# Patient Record
Sex: Female | Born: 2000 | Hispanic: Yes | Marital: Single | Smoking: Never smoker
Health system: Southern US, Community
[De-identification: ages and names within clinical notes are randomized; demographics above are authoritative.]

## PROBLEM LIST (undated history)

## (undated) DIAGNOSIS — D649 Anemia, unspecified: Secondary | ICD-10-CM

## (undated) HISTORY — PX: NO PAST SURGERIES: SHX2092

---
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
## 2020-11-15 ENCOUNTER — Other Ambulatory Visit: Payer: Self-pay | Admitting: Family Medicine

## 2020-11-15 ENCOUNTER — Other Ambulatory Visit (HOSPITAL_COMMUNITY): Payer: Self-pay | Admitting: Family Medicine

## 2020-11-15 DIAGNOSIS — R100 Acute abdomen: Secondary | ICD-10-CM

## 2020-11-21 ENCOUNTER — Other Ambulatory Visit: Payer: Self-pay | Admitting: Family Medicine

## 2020-11-21 DIAGNOSIS — R1031 Right lower quadrant pain: Secondary | ICD-10-CM

## 2020-11-22 ENCOUNTER — Other Ambulatory Visit: Payer: Self-pay

## 2020-11-22 ENCOUNTER — Ambulatory Visit
Admission: RE | Admit: 2020-11-22 | Discharge: 2020-11-22 | Disposition: A | Discharge status: Home / Self Care | Payer: Medicaid Other | Source: Ambulatory Visit | Attending: Family Medicine | Admitting: Family Medicine

## 2020-11-22 DIAGNOSIS — R1031 Right lower quadrant pain: Secondary | ICD-10-CM

## 2020-11-29 IMAGING — US US PELVIS COMPLETE WITH TRANSVAGINAL
1 series · 13 of 25 positions shown · non-contrast
Comparison: None

CLINICAL DATA: Initial evaluation for acute vaginal bleeding,
pelvic cramping. Evaluate IUD placement.



[Series 1: us pelvis complete with transvaginal · 13 of 116 slices shown]
[im 1/116]
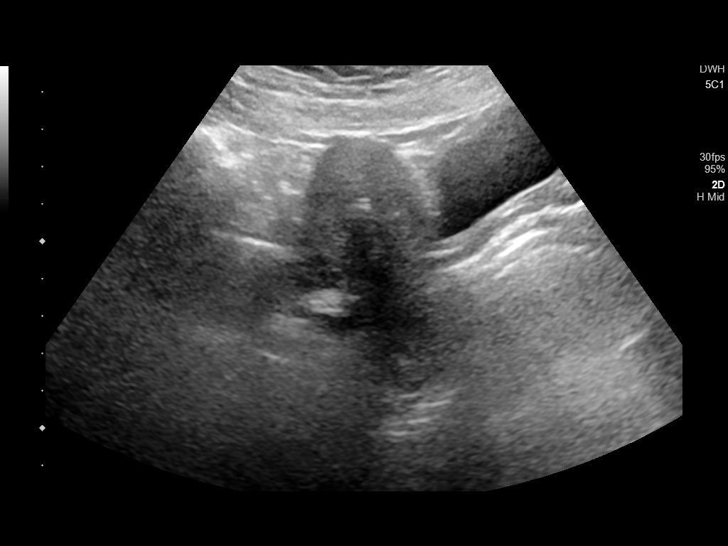
[im 10/116]
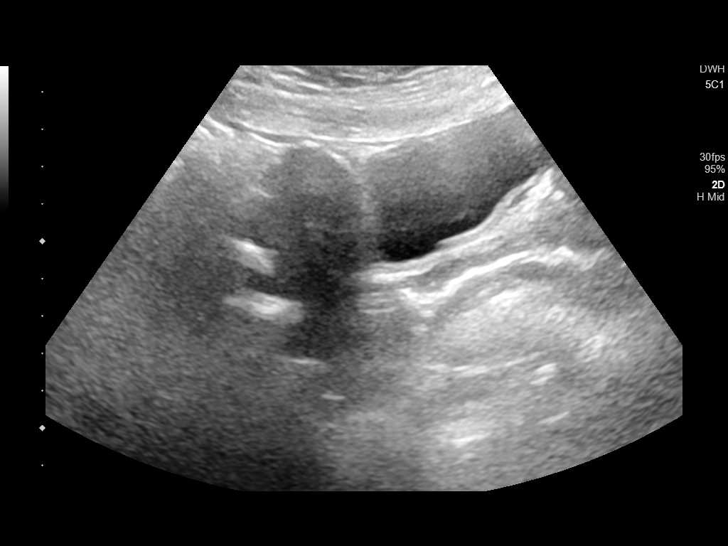
[im 20/116]
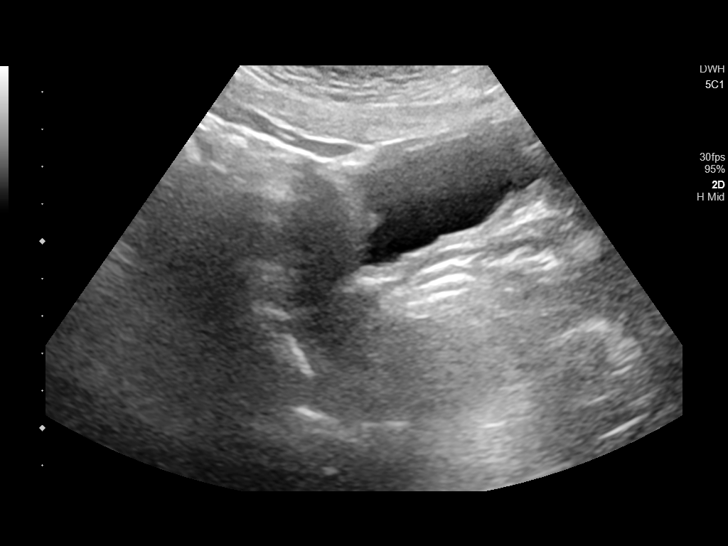
[im 29/116]
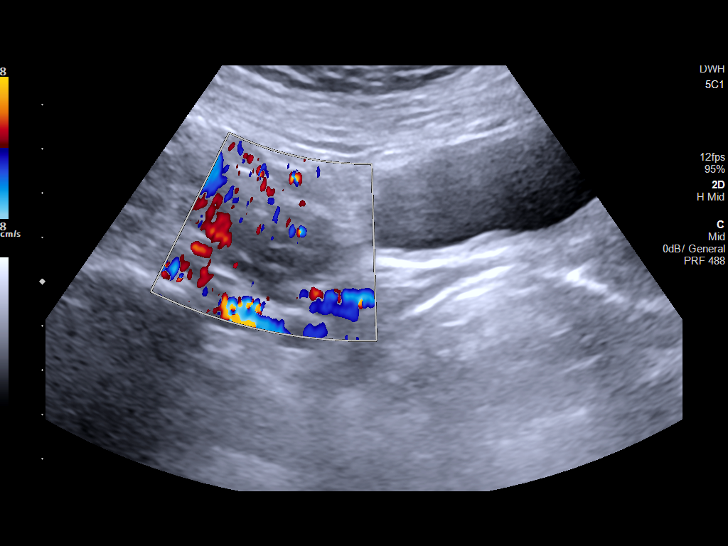
[im 39/116]
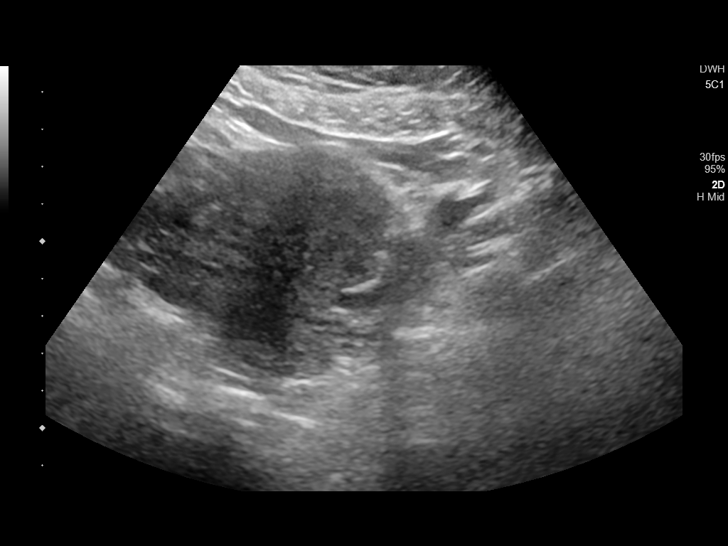
[im 48/116]
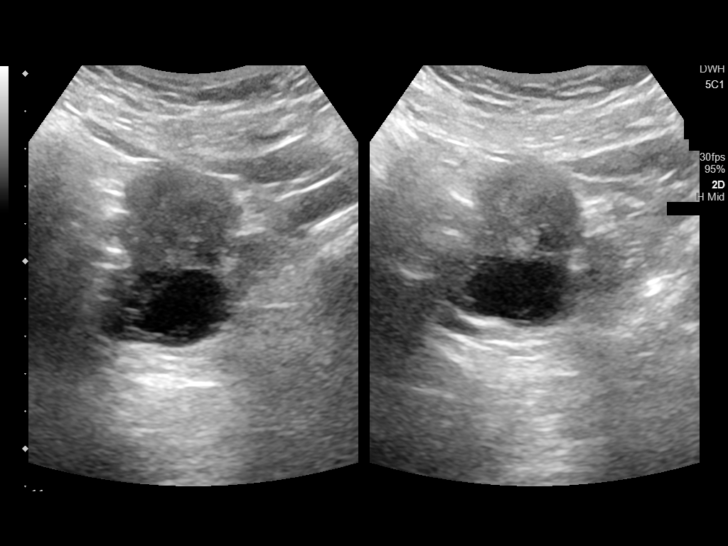
[im 58/116]
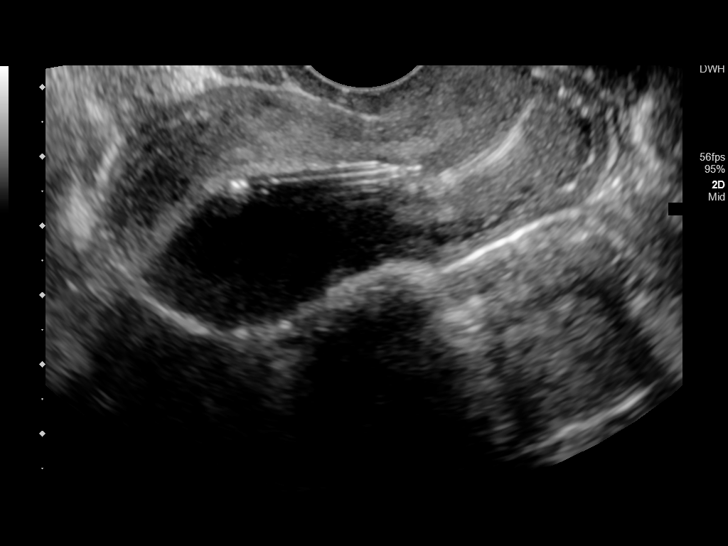
[im 68/116]
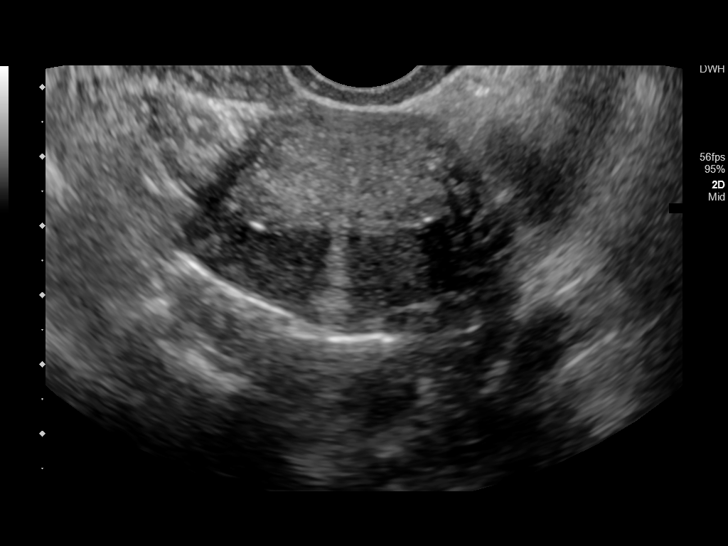
[im 77/116]
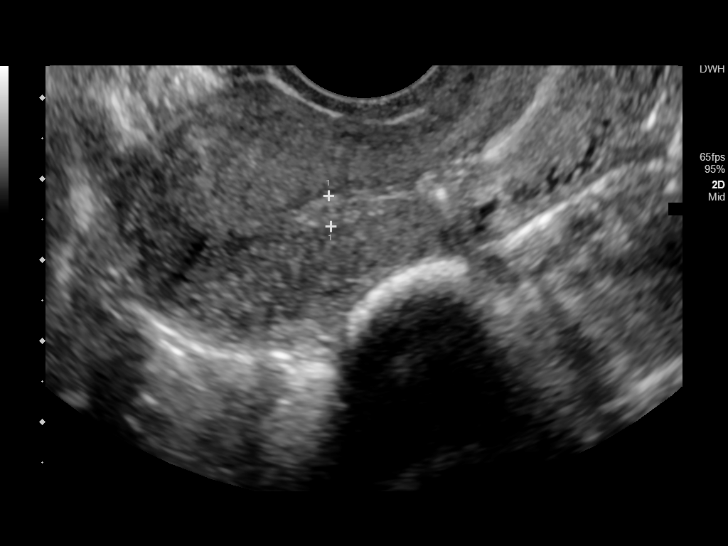
[im 87/116]
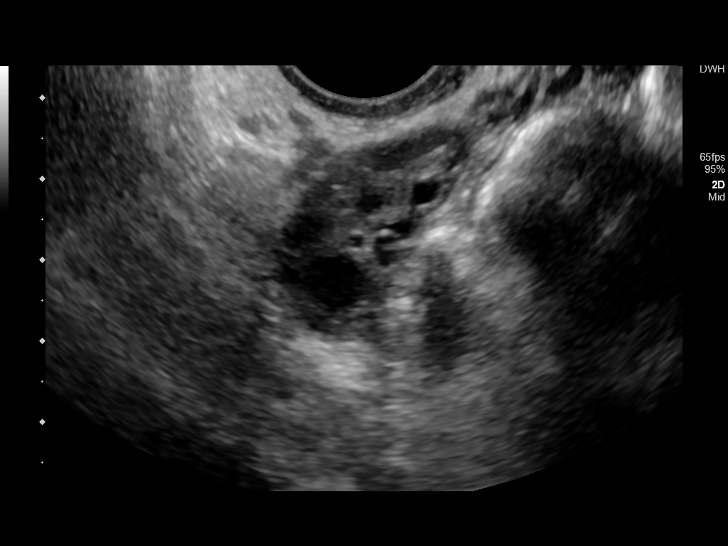
[im 96/116]
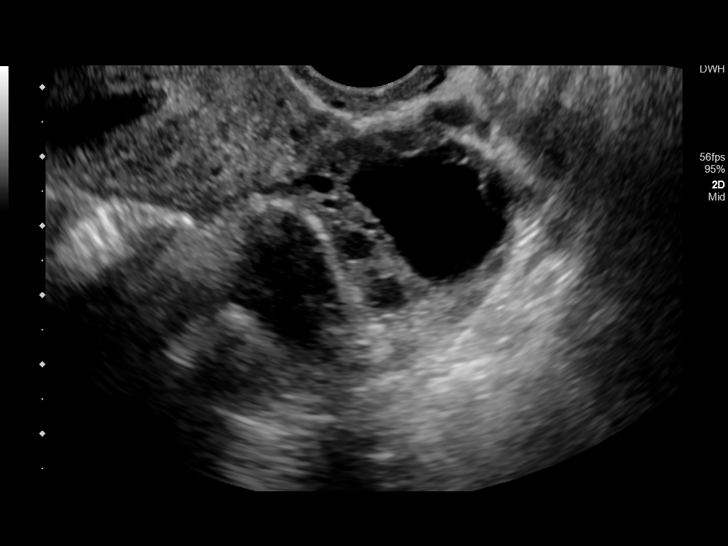
[im 106/116]
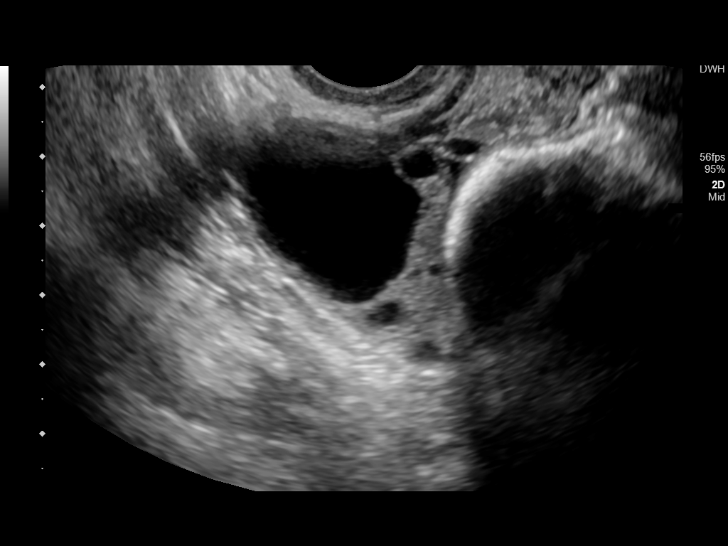
[im 116/116]
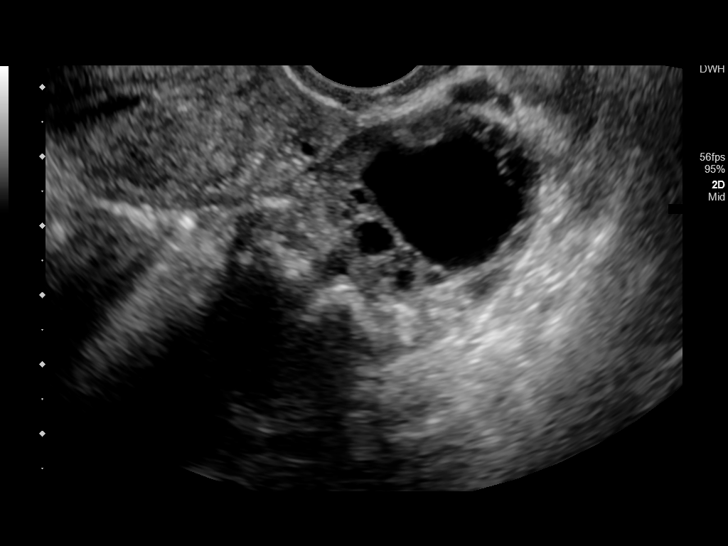

[13 of 25 positions shown; findings below may reference images not displayed]

FINDINGS: Uterus

Measurements: 6.7 x 3.5 x 4.4 cm = volume: 52.8 mL. No fibroids or
other mass visualized.

Endometrium

Thickness: 3.8 mm. No focal abnormality visualized. IUD in
appropriate position within the endometrial cavity.

Right ovary

Measurements: 3.1 x 1.5 x 1.8 cm = volume: 4.5 mL. Normal
appearance/no adnexal mass.

Left ovary

Measurements: 4.4 x 2.8 x 3.2 cm = volume: 20.9 mL. 2.8 x 2.3 x
cm simple cyst, most consistent with a normal physiologic follicular
cyst/dominant follicle.

Other findings

No abnormal free fluid.
IMPRESSION: 1. IUD in appropriate position within the endometrial cavity.
2. 2.8 cm simple left ovarian cyst, most consistent with a normal
physiologic follicular cyst/dominant follicle.
3. Otherwise unremarkable and normal pelvic ultrasound for age.

## 2022-02-12 ENCOUNTER — Ambulatory Visit: Payer: Medicaid Other

## 2022-03-21 IMAGING — US US PELVIS COMPLETE WITH TRANSVAGINAL
1 series · 13 of 25 positions shown · non-contrast
Comparison: Pelvic ultrasound dated 08/03/2019.

CLINICAL DATA: 19-year-old female with right lower quadrant
abdominal pain.

EXAM:
TRANSABDOMINAL AND TRANSVAGINAL ULTRASOUND OF PELVIS
TECHNIQUE: Both transabdominal and transvaginal ultrasound examinations of the
pelvis were performed. Transabdominal technique was performed for
global imaging of the pelvis including uterus, ovaries, adnexal
regions, and pelvic cul-de-sac. It was necessary to proceed with
endovaginal exam following the transabdominal exam to visualize the
endometrium and ovaries.

[Series 1: us pelvic complete with transvaginal · 13 of 94 slices shown]
[im 1/94]
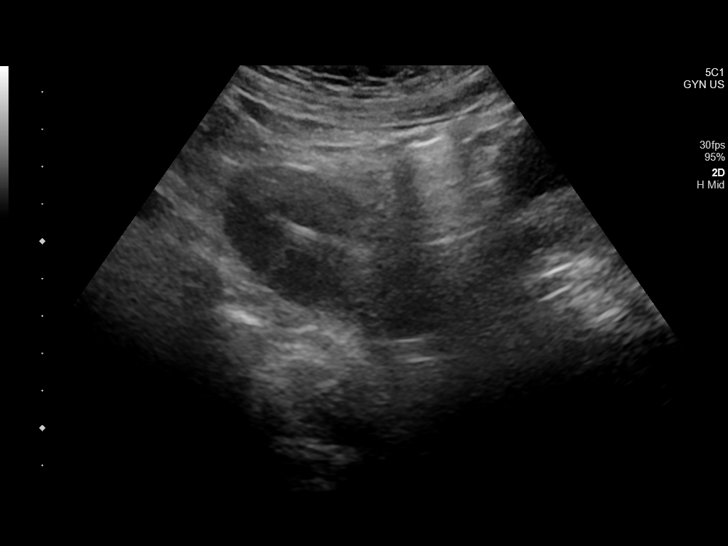
[im 8/94]
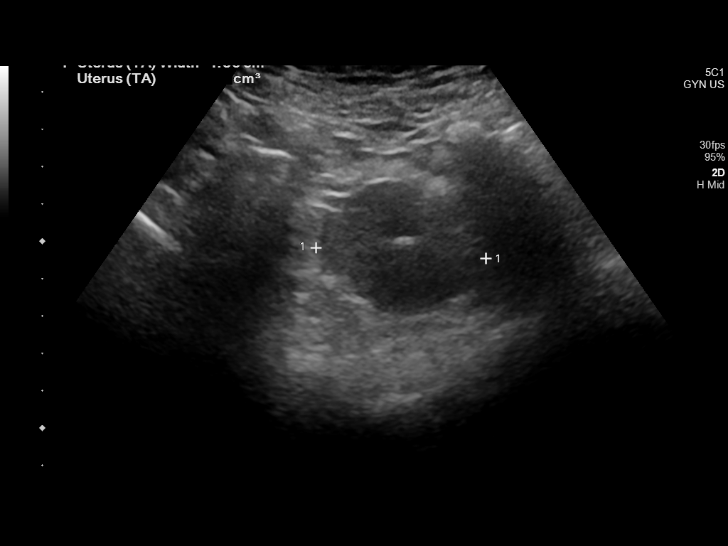
[im 16/94]
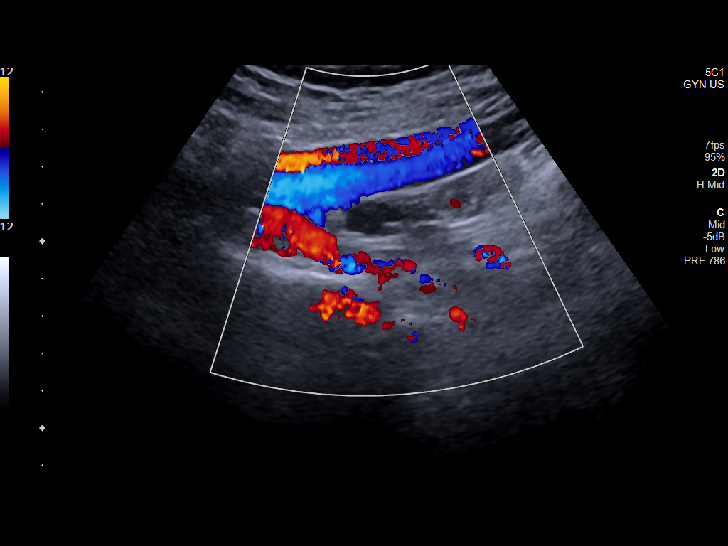
[im 24/94]
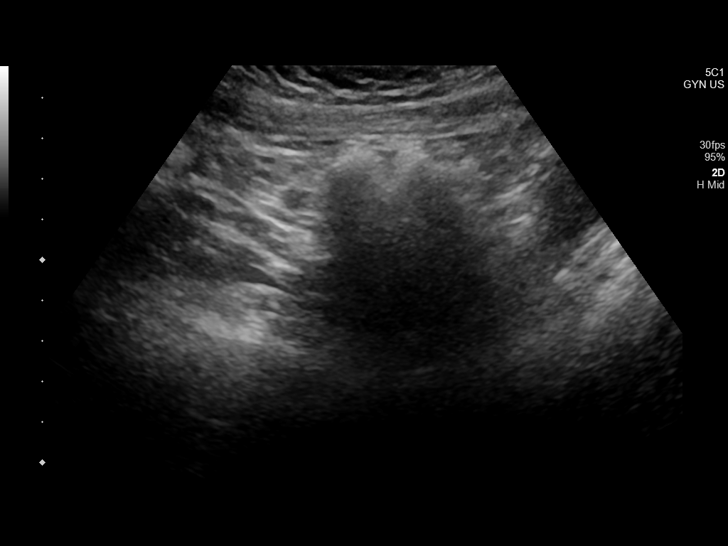
[im 32/94]
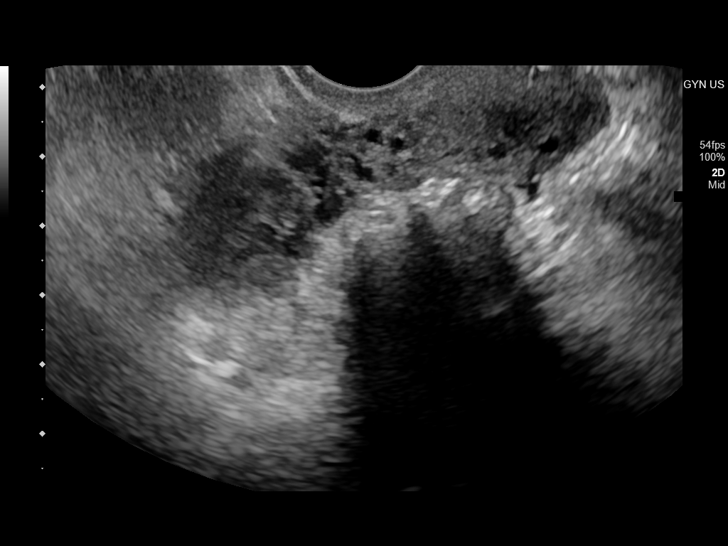
[im 39/94]
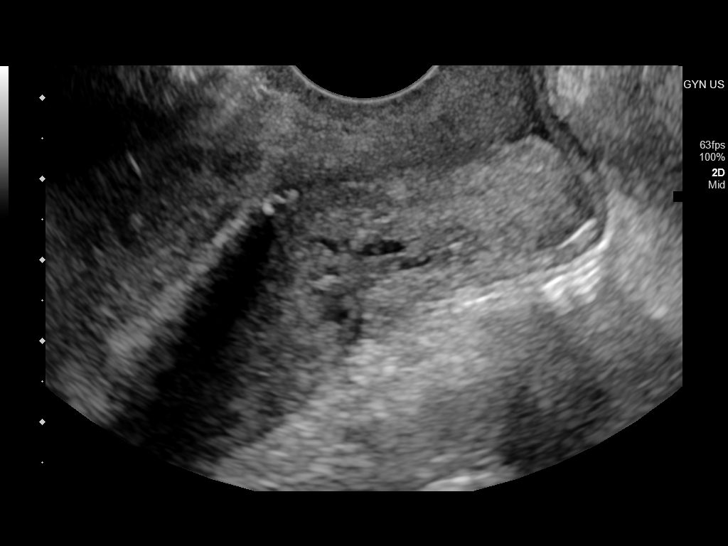
[im 47/94]
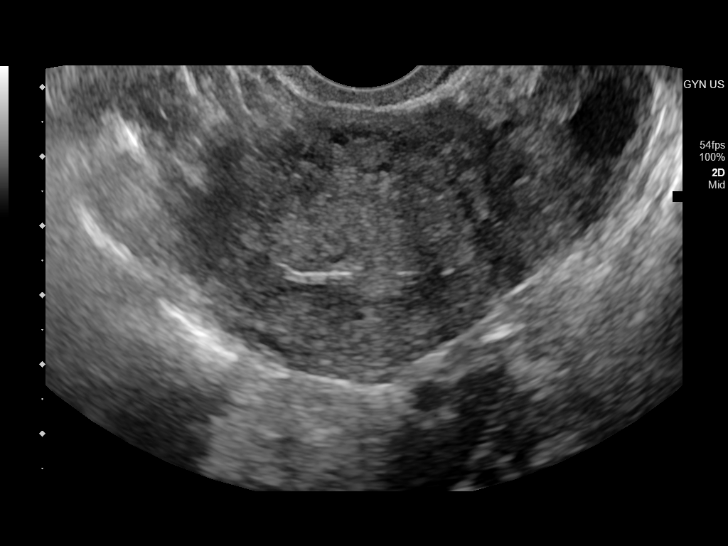
[im 55/94]
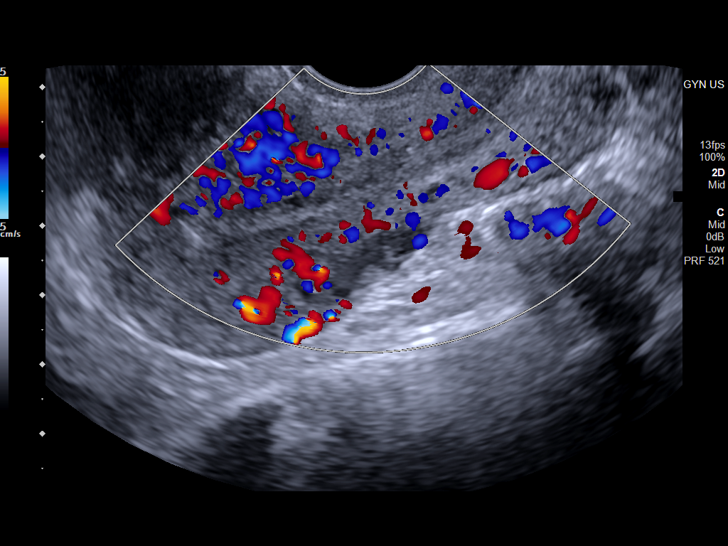
[im 63/94]
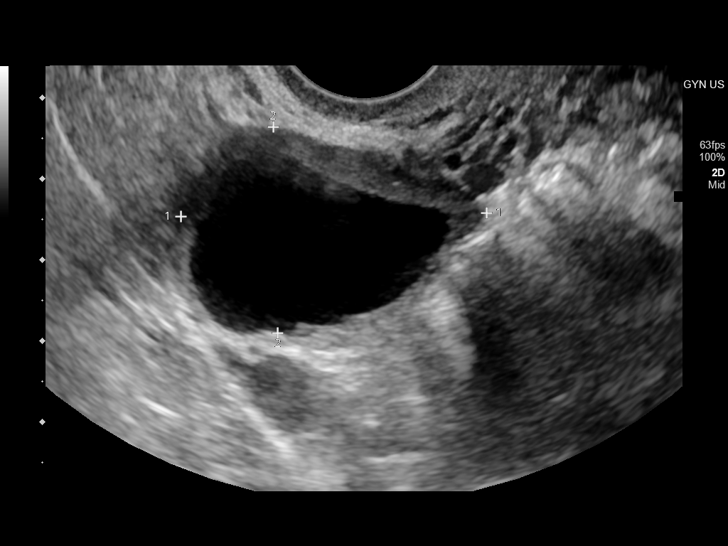
[im 70/94]
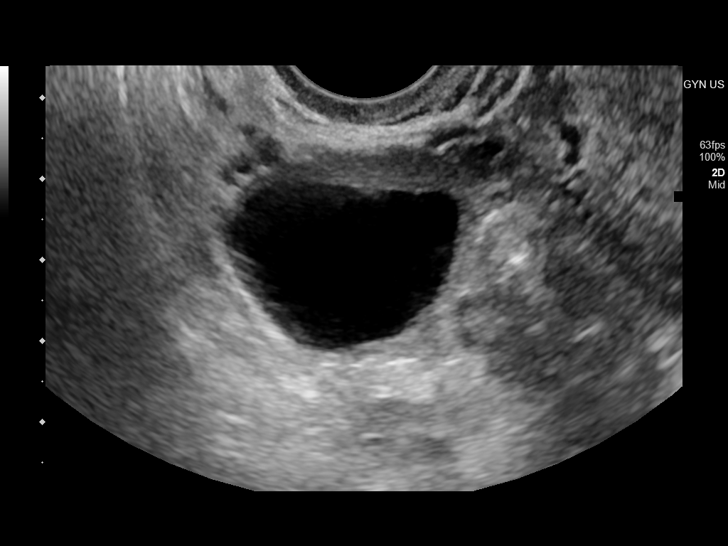
[im 78/94]
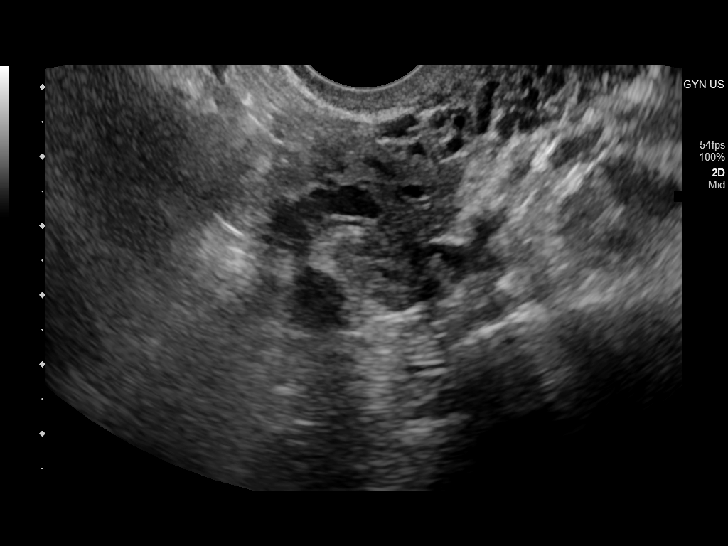
[im 86/94]
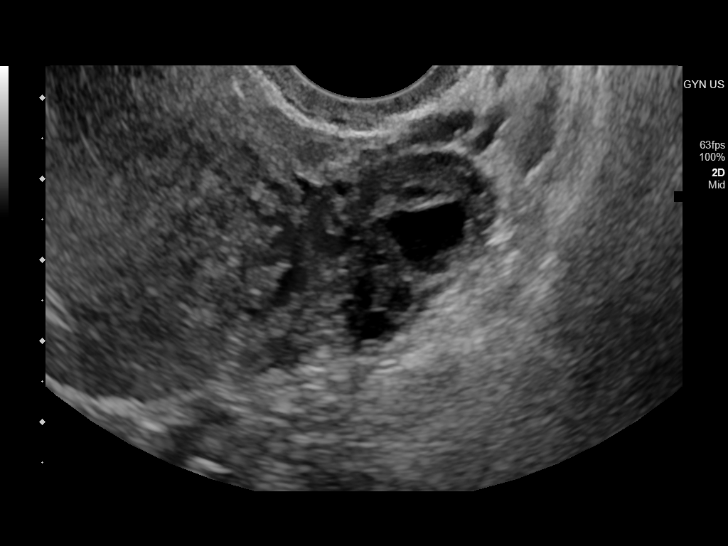
[im 94/94]
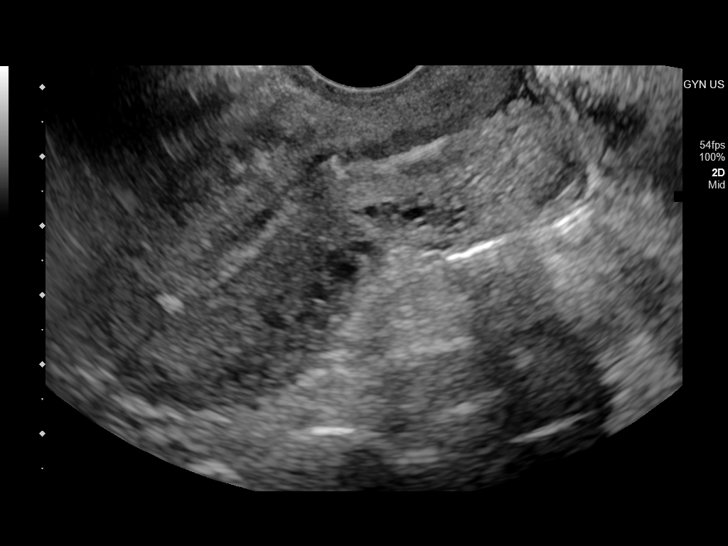

[13 of 25 positions shown; findings below may reference images not displayed]

FINDINGS: Uterus

Measurements: 8.1 x 3.6 x 4.6 cm = volume: 69 mL. No fibroids or
other mass visualized.

Endometrium

Thickness: 4 mm. No focal abnormality identified. Evaluation however
is limited due to presence of an IUD. The IUD appears in appropriate
positioning with arms transversely located in the fundal endometrium
and stem in the mid to upper endometrial body.

Right ovary

Measurements: 3.8 x 2.5 x 3.0 cm = volume: 15 mL. There is a 3.1 x
2.1 x 2.7 cm dominant cyst or corpus luteum. No follow up imaging
recommended. Note: This recommendation does not apply to
premenarchal patients or to those with increased risk (genetic,
family history, elevated tumor markers or other high-risk factors)
of ovarian cancer. Reference: Radiology [DATE]):[id number].

Left ovary

Measurements: 2.8 x 1.7 x 2.1 cm = volume: 5 mL. Normal
appearance/no adnexal mass.

Other findings

No abnormal free fluid.
IMPRESSION: 1. Unremarkable pelvic ultrasound.
2. Dominant physiologic cyst in the right ovary.
3. Intrauterine device appears in appropriate position.
